# Patient Record
Sex: Female | Born: 1980 | Race: White | Hispanic: No | Marital: Single
Health system: Southern US, Community
[De-identification: ages and names within clinical notes are randomized; demographics above are authoritative.]

## PROBLEM LIST (undated history)

## (undated) DIAGNOSIS — T148XXA Other injury of unspecified body region, initial encounter: Secondary | ICD-10-CM

## (undated) DIAGNOSIS — F1910 Other psychoactive substance abuse, uncomplicated: Secondary | ICD-10-CM

## (undated) HISTORY — PX: TUBAL LIGATION: SHX77

## (undated) HISTORY — PX: APPENDECTOMY: SHX54

---
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
## 2001-02-07 ENCOUNTER — Inpatient Hospital Stay (HOSPITAL_COMMUNITY): Admission: AD | Admit: 2001-02-07 | Discharge: 2001-02-07 | Payer: Self-pay | Admitting: *Deleted

## 2001-03-07 ENCOUNTER — Inpatient Hospital Stay (HOSPITAL_COMMUNITY): Admission: AD | Admit: 2001-03-07 | Discharge: 2001-03-10 | Payer: Self-pay | Admitting: *Deleted

## 2005-06-28 ENCOUNTER — Ambulatory Visit (HOSPITAL_COMMUNITY): Admission: RE | Admit: 2005-06-28 | Discharge: 2005-06-28 | Payer: Self-pay | Admitting: *Deleted

## 2005-07-06 ENCOUNTER — Ambulatory Visit: Payer: Self-pay | Admitting: *Deleted

## 2005-07-10 ENCOUNTER — Inpatient Hospital Stay (HOSPITAL_COMMUNITY): Admission: AD | Admit: 2005-07-10 | Discharge: 2005-07-10 | Payer: Self-pay | Admitting: Obstetrics and Gynecology

## 2005-07-20 ENCOUNTER — Ambulatory Visit: Payer: Self-pay | Admitting: Family Medicine

## 2005-08-02 ENCOUNTER — Ambulatory Visit: Payer: Self-pay | Admitting: Obstetrics & Gynecology

## 2005-08-13 ENCOUNTER — Ambulatory Visit: Payer: Self-pay | Admitting: *Deleted

## 2005-08-13 ENCOUNTER — Inpatient Hospital Stay (HOSPITAL_COMMUNITY): Admission: AD | Admit: 2005-08-13 | Discharge: 2005-08-13 | Payer: Self-pay | Admitting: Obstetrics & Gynecology

## 2005-08-16 ENCOUNTER — Inpatient Hospital Stay (HOSPITAL_COMMUNITY): Admission: AD | Admit: 2005-08-16 | Discharge: 2005-08-16 | Payer: Self-pay | Admitting: Obstetrics and Gynecology

## 2005-08-16 ENCOUNTER — Ambulatory Visit: Payer: Self-pay | Admitting: *Deleted

## 2005-08-17 ENCOUNTER — Ambulatory Visit: Payer: Self-pay | Admitting: Family Medicine

## 2005-08-22 ENCOUNTER — Ambulatory Visit: Payer: Self-pay | Admitting: Obstetrics and Gynecology

## 2005-08-22 ENCOUNTER — Inpatient Hospital Stay (HOSPITAL_COMMUNITY): Admission: AD | Admit: 2005-08-22 | Discharge: 2005-08-22 | Payer: Self-pay | Admitting: *Deleted

## 2005-08-24 ENCOUNTER — Ambulatory Visit: Payer: Self-pay | Admitting: Family Medicine

## 2005-08-28 ENCOUNTER — Inpatient Hospital Stay (HOSPITAL_COMMUNITY): Admission: AD | Admit: 2005-08-28 | Discharge: 2005-08-28 | Payer: Self-pay | Admitting: Obstetrics and Gynecology

## 2005-08-28 ENCOUNTER — Ambulatory Visit: Payer: Self-pay | Admitting: Obstetrics and Gynecology

## 2005-08-31 ENCOUNTER — Ambulatory Visit: Payer: Self-pay | Admitting: Family Medicine

## 2005-09-07 ENCOUNTER — Ambulatory Visit: Payer: Self-pay | Admitting: *Deleted

## 2005-09-14 ENCOUNTER — Inpatient Hospital Stay (HOSPITAL_COMMUNITY): Admission: AD | Admit: 2005-09-14 | Discharge: 2005-09-17 | Payer: Self-pay | Admitting: Family Medicine

## 2005-09-14 ENCOUNTER — Ambulatory Visit: Payer: Self-pay | Admitting: Family Medicine

## 2005-09-14 ENCOUNTER — Ambulatory Visit: Payer: Self-pay | Admitting: Obstetrics and Gynecology

## 2016-09-20 DIAGNOSIS — F250 Schizoaffective disorder, bipolar type: Secondary | ICD-10-CM

## 2016-09-20 DIAGNOSIS — Z9151 Personal history of suicidal behavior: Secondary | ICD-10-CM

## 2016-09-20 HISTORY — DX: Schizoaffective disorder, bipolar type: F25.0

## 2016-09-20 HISTORY — DX: Personal history of suicidal behavior: Z91.51

## 2018-09-19 ENCOUNTER — Emergency Department (HOSPITAL_COMMUNITY)
Admission: EM | Admit: 2018-09-19 | Discharge: 2018-09-19 | Disposition: A | Discharge status: Home / Self Care | Payer: Self-pay | Attending: Emergency Medicine | Admitting: Emergency Medicine

## 2018-09-19 ENCOUNTER — Other Ambulatory Visit: Payer: Self-pay

## 2018-09-19 ENCOUNTER — Encounter (HOSPITAL_COMMUNITY): Payer: Self-pay

## 2018-09-19 ENCOUNTER — Emergency Department (HOSPITAL_COMMUNITY): Payer: Self-pay

## 2018-09-19 DIAGNOSIS — J40 Bronchitis, not specified as acute or chronic: Secondary | ICD-10-CM

## 2018-09-19 DIAGNOSIS — J0110 Acute frontal sinusitis, unspecified: Principal | ICD-10-CM

## 2018-09-19 DIAGNOSIS — F17210 Nicotine dependence, cigarettes, uncomplicated: Secondary | ICD-10-CM

## 2018-09-19 DIAGNOSIS — J209 Acute bronchitis, unspecified: Secondary | ICD-10-CM

## 2018-09-19 MED ORDER — PREDNISONE 20 MG PO TABS: 40.0000 mg | ORAL_TABLET | Freq: Every day | ORAL | 0 refills | Status: DC

## 2018-09-19 MED ORDER — AMOXICILLIN-POT CLAVULANATE 875-125 MG PO TABS: 1.0000 | ORAL_TABLET | Freq: Two times a day (BID) | ORAL | 0 refills | Status: DC

## 2018-09-19 NOTE — ED Triage Notes (Signed)
Pt states she has had cough X1 week. She also reports frontal headache, d/t sinus drainage. She reports cough is dry. No distress noted.

## 2018-09-19 NOTE — Discharge Instructions (Addendum)
Take augmentin twice daily for the next 5 days Take Prednisone once daily for 5 days Use Flonase for nasal congestion Use Mucinex DM for chest congestion Return if you are worsening
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
## 2019-07-14 ENCOUNTER — Emergency Department (HOSPITAL_COMMUNITY)
Admission: EM | Admit: 2019-07-14 | Discharge: 2019-07-14 | Disposition: A | Discharge status: Home / Self Care | Payer: Self-pay | Attending: Emergency Medicine | Admitting: Emergency Medicine

## 2019-07-14 ENCOUNTER — Other Ambulatory Visit: Payer: Self-pay

## 2019-07-14 ENCOUNTER — Encounter (HOSPITAL_COMMUNITY): Payer: Self-pay | Admitting: Emergency Medicine

## 2019-07-14 DIAGNOSIS — X58XXXA Exposure to other specified factors, initial encounter: Secondary | ICD-10-CM | POA: Insufficient documentation

## 2019-07-14 DIAGNOSIS — Y999 Unspecified external cause status: Secondary | ICD-10-CM | POA: Insufficient documentation

## 2019-07-14 DIAGNOSIS — Y929 Unspecified place or not applicable: Secondary | ICD-10-CM | POA: Insufficient documentation

## 2019-07-14 DIAGNOSIS — F17210 Nicotine dependence, cigarettes, uncomplicated: Secondary | ICD-10-CM | POA: Insufficient documentation

## 2019-07-14 DIAGNOSIS — T148XXA Other injury of unspecified body region, initial encounter: Secondary | ICD-10-CM

## 2019-07-14 DIAGNOSIS — S301XXA Contusion of abdominal wall, initial encounter: Secondary | ICD-10-CM | POA: Insufficient documentation

## 2019-07-14 DIAGNOSIS — Y939 Activity, unspecified: Secondary | ICD-10-CM | POA: Insufficient documentation
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
## 2019-07-14 NOTE — Discharge Instructions (Signed)
Apply heat to the affected area Please return for any worsening symptoms

## 2019-07-14 NOTE — ED Notes (Signed)
Pt verbalizes understanding of d/c instructions. Pt ambulatory at d/c with all belongings.   

## 2019-07-14 NOTE — ED Notes (Signed)
ED Provider at bedside. 
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
## 2019-07-15 ENCOUNTER — Encounter (HOSPITAL_COMMUNITY): Payer: Self-pay | Admitting: Emergency Medicine

## 2020-03-01 ENCOUNTER — Other Ambulatory Visit: Payer: Self-pay

## 2020-03-01 ENCOUNTER — Encounter (HOSPITAL_COMMUNITY): Payer: Self-pay | Admitting: Emergency Medicine

## 2020-03-01 ENCOUNTER — Emergency Department (HOSPITAL_COMMUNITY)
Admission: EM | Admit: 2020-03-01 | Discharge: 2020-03-01 | Disposition: A | Discharge status: Home / Self Care | Payer: Self-pay | Attending: Emergency Medicine | Admitting: Emergency Medicine

## 2020-03-01 DIAGNOSIS — F17210 Nicotine dependence, cigarettes, uncomplicated: Secondary | ICD-10-CM | POA: Insufficient documentation

## 2020-03-01 DIAGNOSIS — M79652 Pain in left thigh: Secondary | ICD-10-CM | POA: Insufficient documentation

## 2020-03-01 DIAGNOSIS — M5432 Sciatica, left side: Secondary | ICD-10-CM | POA: Insufficient documentation

## 2020-03-01 MED ORDER — METHYLPREDNISOLONE 4 MG PO TBPK: ORAL_TABLET | ORAL | 0 refills | Status: DC

## 2020-03-01 NOTE — Discharge Instructions (Signed)
Follow-up with your primary care doctor if needed.  Please take 600 mg of Motrin every 8 hours for the next 5 days, 1000 mg of Tylenol 4 times a day for the next 5 days and then as needed.  Take Solu-Medrol Dosepak as prescribed.

## 2020-03-01 NOTE — ED Triage Notes (Signed)
Pt c/o left hip pain that started 2 days ago that radiates down her left leg. Denies falls, injuries or lifting.

## 2020-03-01 NOTE — ED Provider Notes (Signed)
[facility] DEPT Provider Note   CSN: [id number] Arrival date & time: 03/01/20  1041     History Chief Complaint  Patient presents with  . hip pain  . Leg Pain    Holly Baxter is a 39 y.o. female.  Patient with left hip pain, back pain that shoots into her left thigh.  Works lifting heavy objects, multiple repetitive actions.  Denies any loss of bowel or bladder, no numbness, tingling, weakness in the lower legs.  The history is provided by the patient.  Leg Pain Location:  Leg and hip Hip location:  L hip Leg location:  L leg Pain details:    Quality:  Aching   Radiates to:  Groin   Severity:  Mild   Onset quality:  Gradual   Timing:  Constant   Progression:  Unchanged Chronicity:  New Prior injury to area:  No Relieved by:  Nothing Worsened by:  Activity Associated symptoms: no back pain, no decreased ROM, no fatigue, no fever, no itching, no muscle weakness, no neck pain, no numbness, no stiffness, no swelling and no tingling        History reviewed. No pertinent past medical history.  There are no problems to display for this patient.   History reviewed. No pertinent surgical history.   OB History   No obstetric history on file.     No family history on file.  Social History   Tobacco Use  . Smoking status: Current Some Day Smoker    Types: Cigarettes  . Smokeless tobacco: Never Used  Substance Use Topics  . Alcohol use: Not Currently  . Drug use: Yes    Comment: hx of drug abuse, recovering addict    Home Medications Prior to Admission medications   Medication Sig Start Date End Date Taking? Authorizing Provider  amoxicillin-clavulanate (AUGMENTIN) 875-125 MG tablet Take 1 tablet by mouth every 12 (twelve) hours. 09/19/18   Recardo Evangelist, PA-C  methylPREDNISolone (MEDROL DOSEPAK) 4 MG TBPK tablet Follow package inserts 03/01/20   Quentez Lober, DO  predniSONE (DELTASONE) 20 MG tablet Take 2 tablets (40  mg total) by mouth daily. 09/19/18   Recardo Evangelist, PA-C    Allergies    Other  Review of Systems   Review of Systems  Constitutional: Negative for chills, fatigue and fever.  HENT: Negative for ear pain and sore throat.   Eyes: Negative for pain and visual disturbance.  Respiratory: Negative for cough and shortness of breath.   Cardiovascular: Negative for chest pain and palpitations.  Gastrointestinal: Negative for abdominal pain and vomiting.  Genitourinary: Negative for dysuria and hematuria.  Musculoskeletal: Positive for gait problem. Negative for arthralgias, back pain, joint swelling, myalgias, neck pain, neck stiffness and stiffness.  Skin: Negative for color change, itching and rash.  Neurological: Negative for dizziness, seizures, syncope, weakness and light-headedness.  All other systems reviewed and are negative.   Physical Exam Updated Vital Signs BP 123/89 (BP Location: Left Arm)   Pulse 82   Temp 97.9 F (36.6 C) (Oral)   Resp 18   LMP 02/19/2020   SpO2 97%   Physical Exam Vitals and nursing note reviewed.  Constitutional:      General: She is not in acute distress.    Appearance: She is well-developed.  HENT:     Head: Normocephalic and atraumatic.     Nose: Nose normal.     Mouth/Throat:     Mouth: Mucous membranes are moist.  Eyes:     Conjunctiva/sclera: Conjunctivae normal.  Cardiovascular:     Rate and Rhythm: Normal rate and regular rhythm.     Heart sounds: No murmur.  Pulmonary:     Effort: Pulmonary effort is normal. No respiratory distress.     Breath sounds: Normal breath sounds.  Abdominal:     Palpations: Abdomen is soft.     Tenderness: There is no abdominal tenderness.  Musculoskeletal:        General: Tenderness present. Normal range of motion.     Cervical back: Normal range of motion and neck supple.     Comments: Positive straight leg test, tenderness to left piriformis area, left paraspinal muscles of the lumbar area, no  midline spinal tenderness  Skin:    General: Skin is warm and dry.  Neurological:     General: No focal deficit present.     Mental Status: She is alert and oriented to person, place, and time.     Sensory: No sensory deficit.     Motor: No weakness.     Comments: 5+ out of 5 strength in the lower extremity, normal sensation lower extremities, antalgic gait     ED Results / Procedures / Treatments   Labs (all labs ordered are listed, but only abnormal results are displayed) Labs Reviewed - No data to display  EKG None  Radiology No results found.  Procedures Procedures (including critical care time)  Medications Ordered in ED Medications - No data to display  ED Course  I have reviewed the triage vital signs and the nursing notes.  Pertinent labs & imaging results that were available during my care of the patient were reviewed by me and considered in my medical decision making (see chart for details).    MDM Rules/Calculators/A&P                      Holly Baxter is a 39 year old female who presents to the ED with low back pain, left leg pain.  Patient describes sciatic type nerve pain.  Pain that goes from her low back into her left thigh.  No numbness, no weakness.  No loss of bowel or bladder.  No concern for cauda equina.  Normal neurological exam.  Antalgic gait in the room.  Denies pregnancy as she has tubal ligation in the past.  Will prescribe Solu-Medrol Dosepak.  Recommend Tylenol, Motrin, rest.  Given return precautions and discharged from the ED in good condition.  This chart was dictated using voice recognition software.  Despite best efforts to proofread,  errors can occur which can change the documentation meaning.   Final Clinical Impression(s) / ED Diagnoses Final diagnoses:  Sciatica of left side    Rx / DC Orders ED Discharge Orders         Ordered    methylPREDNISolone (MEDROL DOSEPAK) 4 MG TBPK tablet     03/01/20 1109            Jaire Pinkham, DO 03/01/20 1109

## 2020-04-11 ENCOUNTER — Emergency Department (HOSPITAL_COMMUNITY)
Admission: EM | Admit: 2020-04-11 | Discharge: 2020-04-11 | Disposition: A | Discharge status: Home / Self Care | Payer: No Typology Code available for payment source | Attending: Emergency Medicine | Admitting: Emergency Medicine

## 2020-04-11 ENCOUNTER — Emergency Department (HOSPITAL_COMMUNITY): Payer: No Typology Code available for payment source

## 2020-04-11 DIAGNOSIS — Y900 Blood alcohol level of less than 20 mg/100 ml: Secondary | ICD-10-CM | POA: Insufficient documentation

## 2020-04-11 DIAGNOSIS — S52322A Displaced transverse fracture of shaft of left radius, initial encounter for closed fracture: Secondary | ICD-10-CM | POA: Diagnosis not present

## 2020-04-11 DIAGNOSIS — Z20822 Contact with and (suspected) exposure to covid-19: Secondary | ICD-10-CM | POA: Diagnosis not present

## 2020-04-11 DIAGNOSIS — T07XXXA Unspecified multiple injuries, initial encounter: Secondary | ICD-10-CM

## 2020-04-11 DIAGNOSIS — S90811A Abrasion, right foot, initial encounter: Secondary | ICD-10-CM | POA: Diagnosis not present

## 2020-04-11 DIAGNOSIS — S0990XA Unspecified injury of head, initial encounter: Secondary | ICD-10-CM | POA: Diagnosis not present

## 2020-04-11 DIAGNOSIS — Y929 Unspecified place or not applicable: Secondary | ICD-10-CM | POA: Diagnosis not present

## 2020-04-11 DIAGNOSIS — S52222A Displaced transverse fracture of shaft of left ulna, initial encounter for closed fracture: Secondary | ICD-10-CM | POA: Insufficient documentation

## 2020-04-11 DIAGNOSIS — S161XXA Strain of muscle, fascia and tendon at neck level, initial encounter: Secondary | ICD-10-CM | POA: Diagnosis not present

## 2020-04-11 DIAGNOSIS — F10920 Alcohol use, unspecified with intoxication, uncomplicated: Secondary | ICD-10-CM | POA: Diagnosis not present

## 2020-04-11 DIAGNOSIS — Y939 Activity, unspecified: Secondary | ICD-10-CM | POA: Insufficient documentation

## 2020-04-11 DIAGNOSIS — S52202A Unspecified fracture of shaft of left ulna, initial encounter for closed fracture: Secondary | ICD-10-CM

## 2020-04-11 DIAGNOSIS — Y999 Unspecified external cause status: Secondary | ICD-10-CM | POA: Insufficient documentation

## 2020-04-11 DIAGNOSIS — S50811A Abrasion of right forearm, initial encounter: Secondary | ICD-10-CM | POA: Diagnosis not present

## 2020-04-11 DIAGNOSIS — S5292XA Unspecified fracture of left forearm, initial encounter for closed fracture: Secondary | ICD-10-CM

## 2020-04-11 DIAGNOSIS — T1490XA Injury, unspecified, initial encounter: Secondary | ICD-10-CM

## 2020-04-11 HISTORY — DX: Unspecified fracture of shaft of left ulna, initial encounter for closed fracture: S52.202A

## 2020-04-11 LAB — CBC
HCT: 38.9 % (ref 36.0–46.0)
Hemoglobin: 13.3 g/dL (ref 12.0–15.0)
MCH: 30.4 pg (ref 26.0–34.0)
MCHC: 34.2 g/dL (ref 30.0–36.0)
MCV: 88.8 fL (ref 80.0–100.0)
Platelets: 278 10*3/uL (ref 150–400)
RBC: 4.38 MIL/uL (ref 3.87–5.11)
RDW: 13.4 % (ref 11.5–15.5)
WBC: 7.8 10*3/uL (ref 4.0–10.5)
nRBC: 0 % (ref 0.0–0.2)

## 2020-04-11 LAB — COMPREHENSIVE METABOLIC PANEL
ALT: 21 U/L (ref 0–44)
AST: 26 U/L (ref 15–41)
Albumin: 4.2 g/dL (ref 3.5–5.0)
Alkaline Phosphatase: 67 U/L (ref 38–126)
Anion gap: 16 — ABNORMAL HIGH (ref 5–15)
BUN: 8 mg/dL (ref 6–20)
CO2: 16 mmol/L — ABNORMAL LOW (ref 22–32)
Calcium: 9.4 mg/dL (ref 8.9–10.3)
Chloride: 103 mmol/L (ref 98–111)
Creatinine, Ser: 0.89 mg/dL (ref 0.44–1.00)
GFR calc Af Amer: >60 mL/min (ref >60)
GFR calc non Af Amer: >60 mL/min (ref >60)
Glucose, Bld: 90 mg/dL (ref 70–99)
Potassium: 3.2 mmol/L — ABNORMAL LOW (ref 3.5–5.1)
Sodium: 135 mmol/L (ref 135–145)
Total Bilirubin: 1.3 mg/dL — ABNORMAL HIGH (ref 0.3–1.2)
Total Protein: 6.8 g/dL (ref 6.5–8.1)

## 2020-04-11 LAB — I-STAT CHEM 8, ED
BUN: 7 mg/dL (ref 6–20)
Calcium, Ion: 1.1 mmol/L — ABNORMAL LOW (ref 1.15–1.40)
Chloride: 105 mmol/L (ref 98–111)
Creatinine, Ser: 0.8 mg/dL (ref 0.44–1.00)
Glucose, Bld: 82 mg/dL (ref 70–99)
HCT: 39 % (ref 36.0–46.0)
Hemoglobin: 13.3 g/dL (ref 12.0–15.0)
Potassium: 3.1 mmol/L — ABNORMAL LOW (ref 3.5–5.1)
Sodium: 135 mmol/L (ref 135–145)
TCO2: 14 mmol/L — ABNORMAL LOW (ref 22–32)

## 2020-04-11 LAB — ETHANOL: Alcohol, Ethyl (B): 17 mg/dL — ABNORMAL HIGH (ref <10)

## 2020-04-11 LAB — SARS CORONAVIRUS 2 BY RT PCR (HOSPITAL ORDER, PERFORMED IN ~~LOC~~ HOSPITAL LAB): SARS Coronavirus 2: NEGATIVE

## 2020-04-11 LAB — SAMPLE TO BLOOD BANK

## 2020-04-11 LAB — PROTIME-INR
INR: 1 (ref 0.8–1.2)
Prothrombin Time: 13.2 seconds (ref 11.4–15.2)

## 2020-04-11 MED ORDER — IOHEXOL 300 MG/ML  SOLN
100.0000 mL | Freq: Once | INTRAMUSCULAR | Status: AC | PRN
  Administered 2020-04-11: 100 mL via INTRAVENOUS

## 2020-04-11 MED ORDER — MORPHINE SULFATE (PF) 4 MG/ML IV SOLN
INTRAVENOUS | Status: AC
  Filled 2020-04-11: qty 1

## 2020-04-11 MED ORDER — HYDROMORPHONE HCL 1 MG/ML IJ SOLN
1.0000 mg | Freq: Once | INTRAMUSCULAR | Status: AC
  Administered 2020-04-11: 1 mg via INTRAVENOUS
  Filled 2020-04-11: qty 1

## 2020-04-11 MED ORDER — OXYCODONE-ACETAMINOPHEN 5-325 MG PO TABS: 1.0000 | ORAL_TABLET | Freq: Four times a day (QID) | ORAL | 0 refills | Status: DC | PRN

## 2020-04-11 MED ORDER — MORPHINE SULFATE (PF) 4 MG/ML IV SOLN
4.0000 mg | Freq: Once | INTRAVENOUS | Status: AC
  Administered 2020-04-11: 4 mg via INTRAVENOUS

## 2020-04-11 NOTE — Progress Notes (Signed)
Orthopedic Tech Progress Note Patient Details:  Holly Baxter 06-Jun-1981 [id number]  Ortho Devices Type of Ortho Device: Arm sling, Post (long) splint Splint Material: Fiberglass Ortho Device/Splint Interventions: Application, Adjustment   Post Interventions Patient Tolerated: Poor   [name] 04/11/2020, 4:10 PM

## 2020-04-11 NOTE — Discharge Instructions (Addendum)
Go to Dr. Greig Right office at the attached address tomorrow morning at 8:30 AM.  He is expecting to see you then and will make arrangements for the surgical repair of your forearm.  In the meantime, keep your arm elevated, iced for 30 minutes every 2 hours while awake, and rested.  Take Percocet as prescribed as needed for pain.

## 2020-04-11 NOTE — ED Provider Notes (Signed)
Pt placed in a sugar tong splint.  The pt is able to ambulate.  She is told to show up at Dr. Greig Right office tomorrow at 0830.  Pt is stable for d/c.  Return if worse.   Jacalyn Lefevre, MD 04/11/20 1651

## 2020-04-11 NOTE — ED Notes (Signed)
Pt transported to CT ?
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
## 2020-04-12 ENCOUNTER — Other Ambulatory Visit: Payer: Self-pay

## 2020-04-12 ENCOUNTER — Encounter (HOSPITAL_BASED_OUTPATIENT_CLINIC_OR_DEPARTMENT_OTHER): Payer: Self-pay | Admitting: Orthopedic Surgery

## 2020-04-12 NOTE — Progress Notes (Signed)
Holly Baxter, Or scheduler for Dr. Eulah Pont, informed of need for pre op orders.Marland Kitchen

## 2020-04-12 NOTE — Progress Notes (Signed)
Spoke w/ via phone for pre-op interview--- PT  Lab needs dos---- Istat (@ ED 04-11-2020 with potassium 3.1, ask MDA if need repeat).  Also ordered urine drug screen, pt stated last used cocaine  04-10-2020, asked MDA if needed.  And need Urine preg.             Lab results------ lab done 04-11-2020 @ ED COVID test ------ covid test done @ED  04-11-2020, negative result in epic,  Pt stated went straight and been quarantined with exception to Dr 04-13-2020 appointment this morning  Arrive at ------- 0645 NPO after ------ [place] Medications to take morning of surgery ----- NONE Diabetic medication ----- n/a Patient Special Instructions ----- do not do any drugs or drink alcohol Pre-Op special Istructions ----- n/a Patient verbalized understanding of instructions that were given at this phone interview. Patient denies shortness of breath, chest pain, fever, cough a this phone interview.   Anesthesia review:  Due to drug use.  Chart to be reviewed by Eulah Pont PA.

## 2020-04-13 ENCOUNTER — Ambulatory Visit (HOSPITAL_BASED_OUTPATIENT_CLINIC_OR_DEPARTMENT_OTHER)
Admission: RE | Admit: 2020-04-13 | Discharge: 2020-04-13 | Disposition: A | Discharge status: Home / Self Care | Payer: Self-pay | Attending: Orthopedic Surgery | Admitting: Orthopedic Surgery

## 2020-04-13 ENCOUNTER — Encounter (HOSPITAL_BASED_OUTPATIENT_CLINIC_OR_DEPARTMENT_OTHER)
Admission: RE | Disposition: A | Discharge status: Home / Self Care | Payer: Self-pay | Source: Home / Self Care | Attending: Orthopedic Surgery

## 2020-04-13 ENCOUNTER — Ambulatory Visit (HOSPITAL_BASED_OUTPATIENT_CLINIC_OR_DEPARTMENT_OTHER): Payer: Self-pay | Admitting: Physician Assistant

## 2020-04-13 ENCOUNTER — Other Ambulatory Visit: Payer: Self-pay

## 2020-04-13 ENCOUNTER — Encounter (HOSPITAL_BASED_OUTPATIENT_CLINIC_OR_DEPARTMENT_OTHER): Payer: Self-pay | Admitting: Orthopedic Surgery

## 2020-04-13 DIAGNOSIS — F17210 Nicotine dependence, cigarettes, uncomplicated: Secondary | ICD-10-CM | POA: Insufficient documentation

## 2020-04-13 DIAGNOSIS — S52322A Displaced transverse fracture of shaft of left radius, initial encounter for closed fracture: Secondary | ICD-10-CM | POA: Insufficient documentation

## 2020-04-13 DIAGNOSIS — S52202S Unspecified fracture of shaft of left ulna, sequela: Secondary | ICD-10-CM

## 2020-04-13 DIAGNOSIS — F250 Schizoaffective disorder, bipolar type: Secondary | ICD-10-CM | POA: Insufficient documentation

## 2020-04-13 DIAGNOSIS — S52222A Displaced transverse fracture of shaft of left ulna, initial encounter for closed fracture: Secondary | ICD-10-CM | POA: Insufficient documentation

## 2020-04-13 DIAGNOSIS — M659 Synovitis and tenosynovitis, unspecified: Secondary | ICD-10-CM | POA: Insufficient documentation

## 2020-04-13 HISTORY — PX: ORIF RADIAL FRACTURE: SHX5113

## 2020-04-13 HISTORY — DX: Other injury of unspecified body region, initial encounter: T14.8XXA

## 2020-04-13 HISTORY — DX: Other psychoactive substance abuse, uncomplicated: F19.10

## 2020-04-13 LAB — POCT I-STAT, CHEM 8
BUN: 7 mg/dL (ref 6–20)
Calcium, Ion: 1.25 mmol/L (ref 1.15–1.40)
Chloride: 106 mmol/L (ref 98–111)
Creatinine, Ser: 0.7 mg/dL (ref 0.44–1.00)
Glucose, Bld: 101 mg/dL — ABNORMAL HIGH (ref 70–99)
HCT: 34 % — ABNORMAL LOW (ref 36.0–46.0)
Hemoglobin: 11.6 g/dL — ABNORMAL LOW (ref 12.0–15.0)
Potassium: 3.3 mmol/L — ABNORMAL LOW (ref 3.5–5.1)
Sodium: 141 mmol/L (ref 135–145)
TCO2: 26 mmol/L (ref 22–32)

## 2020-04-13 LAB — POCT PREGNANCY, URINE: Preg Test, Ur: NEGATIVE

## 2020-04-13 LAB — RAPID URINE DRUG SCREEN, HOSP PERFORMED
Amphetamines: NOT DETECTED
Barbiturates: NOT DETECTED
Benzodiazepines: NOT DETECTED
Cocaine: POSITIVE — AB
Opiates: POSITIVE — AB
Tetrahydrocannabinol: POSITIVE — AB

## 2020-04-13 SURGERY — OPEN REDUCTION INTERNAL FIXATION (ORIF) RADIAL FRACTURE
Anesthesia: General | Site: Arm Lower | Laterality: Left

## 2020-04-13 MED ORDER — DEXMEDETOMIDINE HCL IN NACL 400 MCG/100ML IV SOLN
INTRAVENOUS | Status: DC | PRN
Start: 2020-04-13 — End: 2020-04-13
  Administered 2020-04-13 (×6): 4 ug via INTRAVENOUS

## 2020-04-13 MED ORDER — LACTATED RINGERS IV SOLN: INTRAVENOUS | Status: DC

## 2020-04-13 MED ORDER — CELECOXIB 200 MG PO CAPS: 200.0000 mg | ORAL_CAPSULE | Freq: Two times a day (BID) | ORAL | 0 refills | Status: AC

## 2020-04-13 MED ORDER — MIDAZOLAM HCL 2 MG/2ML IJ SOLN
2.0000 mg | Freq: Once | INTRAMUSCULAR | Status: AC
  Administered 2020-04-13: 2 mg via INTRAVENOUS

## 2020-04-13 MED ORDER — MIDAZOLAM HCL 2 MG/2ML IJ SOLN
INTRAMUSCULAR | Status: AC
  Filled 2020-04-13: qty 2

## 2020-04-13 MED ORDER — CHLORHEXIDINE GLUCONATE 4 % EX LIQD: 60.0000 mL | Freq: Once | CUTANEOUS | Status: DC

## 2020-04-13 MED ORDER — MIDAZOLAM HCL 2 MG/2ML IJ SOLN
INTRAMUSCULAR | Status: DC | PRN
  Administered 2020-04-13: 2 mg via INTRAVENOUS

## 2020-04-13 MED ORDER — ROPIVACAINE HCL 5 MG/ML IJ SOLN
INTRAMUSCULAR | Status: DC | PRN
  Administered 2020-04-13: 30 mL via EPIDURAL

## 2020-04-13 MED ORDER — OXYCODONE HCL 5 MG PO TABS: 5.0000 mg | ORAL_TABLET | ORAL | 0 refills | Status: AC | PRN

## 2020-04-13 MED ORDER — PROPOFOL 500 MG/50ML IV EMUL
INTRAVENOUS | Status: AC
  Filled 2020-04-13: qty 50

## 2020-04-13 MED ORDER — ORAL CARE MOUTH RINSE: 15.0000 mL | Freq: Once | OROMUCOSAL | Status: DC

## 2020-04-13 MED ORDER — LIDOCAINE 2% (20 MG/ML) 5 ML SYRINGE
INTRAMUSCULAR | Status: AC
  Filled 2020-04-13: qty 5

## 2020-04-13 MED ORDER — ONDANSETRON HCL 4 MG/2ML IJ SOLN: 4.0000 mg | Freq: Once | INTRAMUSCULAR | Status: DC | PRN

## 2020-04-13 MED ORDER — HYDROCODONE-ACETAMINOPHEN 7.5-325 MG PO TABS: 1.0000 | ORAL_TABLET | Freq: Once | ORAL | Status: DC | PRN

## 2020-04-13 MED ORDER — ONDANSETRON HCL 4 MG/2ML IJ SOLN
INTRAMUSCULAR | Status: DC | PRN
  Administered 2020-04-13: 4 mg via INTRAVENOUS

## 2020-04-13 MED ORDER — CEFAZOLIN SODIUM-DEXTROSE 2-4 GM/100ML-% IV SOLN
2.0000 g | INTRAVENOUS | Status: AC
  Administered 2020-04-13: 2 g via INTRAVENOUS

## 2020-04-13 MED ORDER — ACETAMINOPHEN 500 MG PO TABS
1000.0000 mg | ORAL_TABLET | Freq: Three times a day (TID) | ORAL | 0 refills | Status: AC
Start: 2020-04-13 — End: 2020-04-23

## 2020-04-13 MED ORDER — CHLORHEXIDINE GLUCONATE 0.12 % MT SOLN
OROMUCOSAL | Status: AC
  Filled 2020-04-13: qty 15

## 2020-04-13 MED ORDER — ONDANSETRON HCL 4 MG/2ML IJ SOLN
INTRAMUSCULAR | Status: AC
  Filled 2020-04-13: qty 2

## 2020-04-13 MED ORDER — FENTANYL CITRATE (PF) 100 MCG/2ML IJ SOLN
INTRAMUSCULAR | Status: AC
  Filled 2020-04-13: qty 2

## 2020-04-13 MED ORDER — ACETAMINOPHEN 500 MG PO TABS
ORAL_TABLET | ORAL | Status: AC
  Filled 2020-04-13: qty 2

## 2020-04-13 MED ORDER — FENTANYL CITRATE (PF) 100 MCG/2ML IJ SOLN
100.0000 ug | Freq: Once | INTRAMUSCULAR | Status: AC
  Administered 2020-04-13: 100 ug via INTRAVENOUS

## 2020-04-13 MED ORDER — GABAPENTIN 300 MG PO CAPS: 300.0000 mg | ORAL_CAPSULE | Freq: Two times a day (BID) | ORAL | 0 refills | Status: AC

## 2020-04-13 MED ORDER — DEXAMETHASONE SODIUM PHOSPHATE 10 MG/ML IJ SOLN
INTRAMUSCULAR | Status: AC
  Filled 2020-04-13: qty 1

## 2020-04-13 MED ORDER — LIDOCAINE 2% (20 MG/ML) 5 ML SYRINGE
INTRAMUSCULAR | Status: DC | PRN
  Administered 2020-04-13: 80 mg via INTRAVENOUS

## 2020-04-13 MED ORDER — PROPOFOL 10 MG/ML IV BOLUS
INTRAVENOUS | Status: DC | PRN
  Administered 2020-04-13: 150 mg via INTRAVENOUS

## 2020-04-13 MED ORDER — DEXAMETHASONE SODIUM PHOSPHATE 10 MG/ML IJ SOLN
INTRAMUSCULAR | Status: DC | PRN
  Administered 2020-04-13: 10 mg via INTRAVENOUS

## 2020-04-13 MED ORDER — ONDANSETRON HCL 4 MG PO TABS: 4.0000 mg | ORAL_TABLET | Freq: Three times a day (TID) | ORAL | 0 refills | Status: AC | PRN

## 2020-04-13 MED ORDER — ACETAMINOPHEN 500 MG PO TABS
1000.0000 mg | ORAL_TABLET | Freq: Once | ORAL | Status: AC
  Administered 2020-04-13: 1000 mg via ORAL

## 2020-04-13 MED ORDER — FENTANYL CITRATE (PF) 100 MCG/2ML IJ SOLN
INTRAMUSCULAR | Status: DC | PRN
  Administered 2020-04-13 (×2): 50 ug via INTRAVENOUS

## 2020-04-13 MED ORDER — CHLORHEXIDINE GLUCONATE 0.12 % MT SOLN: 15.0000 mL | Freq: Once | OROMUCOSAL | Status: DC

## 2020-04-13 MED ORDER — CEFAZOLIN SODIUM-DEXTROSE 2-4 GM/100ML-% IV SOLN
INTRAVENOUS | Status: AC
  Filled 2020-04-13: qty 100

## 2020-04-13 MED ORDER — FENTANYL CITRATE (PF) 100 MCG/2ML IJ SOLN: 25.0000 ug | INTRAMUSCULAR | Status: DC | PRN

## 2020-04-13 MED ORDER — DEXMEDETOMIDINE HCL IN NACL 200 MCG/50ML IV SOLN
INTRAVENOUS | Status: AC
  Filled 2020-04-13: qty 50

## 2020-04-13 SURGICAL SUPPLY — 98 items
2.0mm drill bit ×1 IMPLANT
2.6 mm drill bit ×2 IMPLANT
2.7 mm drill bit ×1 IMPLANT
APL PRP STRL LF DISP 70% ISPRP (MISCELLANEOUS) ×1
APL SKNCLS STERI-STRIP NONHPOA (GAUZE/BANDAGES/DRESSINGS) ×1
BENZOIN TINCTURE PRP APPL 2/3 (GAUZE/BANDAGES/DRESSINGS) ×2 IMPLANT
BIT DRILL 122X2.7XOVR NS (BIT) IMPLANT
BIT DRILL 135X2XAO FIT SCL (BIT) IMPLANT
BIT DRILL 2.0 (BIT) ×3
BIT DRILL 2.6 (BIT) ×2
BIT DRILL 2.6MM (BIT) ×1
BIT DRILL 2.6X VARIAX 2 (BIT) IMPLANT
BIT DRILL 2.7 (BIT) ×3
BIT DRL 122X2.7XOVR NS (BIT) ×1
BIT DRL 135X2XAO FIT SCL (BIT) ×1
BIT DRL 2.6X VARIAX 2 (BIT) ×1
BLADE CLIPPER SENSICLIP SURGIC (BLADE) IMPLANT
BLADE SURG 15 STRL LF DISP TIS (BLADE) ×1 IMPLANT
BLADE SURG 15 STRL SS (BLADE) ×6
BNDG CMPR 9X4 STRL LF SNTH (GAUZE/BANDAGES/DRESSINGS) ×1
BNDG COHESIVE 4X5 TAN STRL (GAUZE/BANDAGES/DRESSINGS) IMPLANT
BNDG ELASTIC 3X5.8 VLCR STR LF (GAUZE/BANDAGES/DRESSINGS) ×2 IMPLANT
BNDG ELASTIC 4X5.8 VLCR STR LF (GAUZE/BANDAGES/DRESSINGS) ×3 IMPLANT
BNDG ESMARK 4X9 LF (GAUZE/BANDAGES/DRESSINGS) ×3 IMPLANT
CHLORAPREP W/TINT 26 (MISCELLANEOUS) ×3 IMPLANT
CLOSURE STERI-STRIP 1/2X4 (GAUZE/BANDAGES/DRESSINGS) ×1
CLOSURE WOUND 1/2 X4 (GAUZE/BANDAGES/DRESSINGS) ×1
CLSR STERI-STRIP ANTIMIC 1/2X4 (GAUZE/BANDAGES/DRESSINGS) ×2 IMPLANT
CORD BIPOLAR FORCEPS 12FT (ELECTRODE) IMPLANT
COVER BACK TABLE 60X90IN (DRAPES) IMPLANT
COVER WAND RF STERILE (DRAPES) ×3 IMPLANT
CUFF TOURN SGL QUICK 18X4 (TOURNIQUET CUFF) IMPLANT
CUFF TOURN SGL QUICK 24 (TOURNIQUET CUFF)
CUFF TRNQT CYL 24X4X16.5-23 (TOURNIQUET CUFF) IMPLANT
DRAPE EXTREMITY T 121X128X90 (DISPOSABLE) ×3 IMPLANT
DRAPE IMP U-DRAPE 54X76 (DRAPES) ×3 IMPLANT
DRAPE OEC MINIVIEW 54X84 (DRAPES) ×3 IMPLANT
DRAPE U-SHAPE 47X51 STRL (DRAPES) ×3 IMPLANT
DRSG EMULSION OIL 3X3 NADH (GAUZE/BANDAGES/DRESSINGS) ×2 IMPLANT
ELECT REM PT RETURN 9FT ADLT (ELECTROSURGICAL) ×3
ELECTRODE REM PT RTRN 9FT ADLT (ELECTROSURGICAL) ×1 IMPLANT
GAUZE SPONGE 4X4 12PLY STRL (GAUZE/BANDAGES/DRESSINGS) ×3 IMPLANT
GAUZE XEROFORM 1X8 LF (GAUZE/BANDAGES/DRESSINGS) IMPLANT
GLOVE BIO SURGEON STRL SZ7.5 (GLOVE) ×6 IMPLANT
GLOVE BIOGEL PI IND STRL 8 (GLOVE) ×2 IMPLANT
GLOVE BIOGEL PI INDICATOR 8 (GLOVE) ×4
GOWN STRL REUS W/TWL LRG LVL3 (GOWN DISPOSABLE) ×8 IMPLANT
GOWN STRL REUS W/TWL XL LVL3 (GOWN DISPOSABLE) ×6 IMPLANT
NDL HYPO 25X1 1.5 SAFETY (NEEDLE) IMPLANT
NEEDLE HYPO 25X1 1.5 SAFETY (NEEDLE) IMPLANT
NS IRRIG 1000ML POUR BTL (IV SOLUTION) ×3 IMPLANT
PACK DSU ARTHROSCOPY (CUSTOM PROCEDURE TRAY) ×3 IMPLANT
PAD CAST 4YDX4 CTTN HI CHSV (CAST SUPPLIES) ×1 IMPLANT
PADDING CAST ABS 4INX4YD NS (CAST SUPPLIES) ×2
PADDING CAST ABS COTTON 4X4 ST (CAST SUPPLIES) ×1 IMPLANT
PADDING CAST COTTON 4X4 STRL (CAST SUPPLIES) ×12
PASSER SUT SWANSON 36MM LOOP (INSTRUMENTS) IMPLANT
PENCIL BUTTON HOLSTER BLD 10FT (ELECTRODE) ×3 IMPLANT
PLATE STR 6H (Plate) ×2 IMPLANT
PLATE TUBULAR 1/3 6H (Plate) ×2 IMPLANT
SCREW BN T10 FT 14X3.5XSTRDR (Screw) IMPLANT
SCREW BONE 2.7X16 NON-LOCKING (Screw) ×2 IMPLANT
SCREW BONE 3.5X14MM (Screw) ×18 IMPLANT
SCREW BONE THRD T10 3.5X12 (Screw) ×10 IMPLANT
SCREWDRIVER BLADE T-10 (BLADE) ×2 IMPLANT
SET BASIN DAY SURGERY F.S. (CUSTOM PROCEDURE TRAY) ×3 IMPLANT
SLING ARM FOAM STRAP LRG (SOFTGOODS) ×2 IMPLANT
SLING ARM FOAM STRAP MED (SOFTGOODS) IMPLANT
SLING ARM FOAM STRAP XLG (SOFTGOODS) IMPLANT
SLING ARM IMMOBILIZER LRG (SOFTGOODS) IMPLANT
SLING ARM IMMOBILIZER MED (SOFTGOODS) IMPLANT
SPLINT FAST PLASTER 5X30 (CAST SUPPLIES) ×20
SPLINT PLASTER CAST FAST 5X30 (CAST SUPPLIES) ×10 IMPLANT
SPONGE LAP 18X18 RF (DISPOSABLE) ×3 IMPLANT
STAPLER VISISTAT 35W (STAPLE) IMPLANT
STRIP CLOSURE SKIN 1/2X4 (GAUZE/BANDAGES/DRESSINGS) ×1 IMPLANT
SUCTION FRAZIER HANDLE 10FR (MISCELLANEOUS) ×3
SUCTION TUBE FRAZIER 10FR DISP (MISCELLANEOUS) ×1 IMPLANT
SUT ETHILON 3 0 PS 1 (SUTURE) IMPLANT
SUT FIBERWIRE #2 38 T-5 BLUE (SUTURE)
SUT MNCRL AB 4-0 PS2 18 (SUTURE) ×2 IMPLANT
SUT PROLENE 3 0 PS 2 (SUTURE) IMPLANT
SUT VIC AB 0 CT1 27 (SUTURE) ×3
SUT VIC AB 0 CT1 27XBRD ANBCTR (SUTURE) ×1 IMPLANT
SUT VIC AB 0 SH 27 (SUTURE) IMPLANT
SUT VIC AB 2-0 SH 27 (SUTURE) ×6
SUT VIC AB 2-0 SH 27XBRD (SUTURE) IMPLANT
SUT VIC AB 3-0 SH 27 (SUTURE)
SUT VIC AB 3-0 SH 27X BRD (SUTURE) IMPLANT
SUTURE FIBERWR #2 38 T-5 BLUE (SUTURE) IMPLANT
SYR BULB EAR ULCER 3OZ GRN STR (SYRINGE) ×3 IMPLANT
SYR CONTROL 10ML LL (SYRINGE) IMPLANT
TOWEL OR 17X26 10 PK STRL BLUE (TOWEL DISPOSABLE) ×3 IMPLANT
TUBE CONNECTING 12'X1/4 (SUCTIONS)
TUBE CONNECTING 12X1/4 (SUCTIONS) IMPLANT
UNDERPAD 30X30 (UNDERPADS AND DIAPERS) ×3 IMPLANT
YANKAUER SUCT BULB TIP NO VENT (SUCTIONS) IMPLANT
t-10 driver blade ×1 IMPLANT

## 2020-04-13 NOTE — Anesthesia Procedure Notes (Addendum)
Procedure Name: LMA Insertion Date/Time: 04/13/2020 8:52 AM Performed by: Norva Pavlov, CRNA Pre-anesthesia Checklist: Patient identified, Emergency Drugs available, Suction available and Patient being monitored Patient Re-evaluated:Patient Re-evaluated prior to induction Oxygen Delivery Method: Circle system utilized Preoxygenation: Pre-oxygenation with 100% oxygen Induction Type: IV induction Ventilation: Mask ventilation without difficulty LMA: LMA inserted LMA Size: 4.0 Number of attempts: 1 Airway Equipment and Method: Bite block Placement Confirmation: positive ETCO2 Tube secured with: Tape Dental Injury: Teeth and Oropharynx as per pre-operative assessment

## 2020-04-13 NOTE — Anesthesia Postprocedure Evaluation (Signed)
Anesthesia Post Note  Patient: Holly Baxter  Procedure(s) Performed: OPEN REDUCTION INTERNAL FIXATION (ORIF) RADIAL AND ULNAR  FRACTURE (Left Arm Lower)     Patient location during evaluation: PACU Anesthesia Type: General Level of consciousness: awake and alert and oriented Pain management: pain level controlled Vital Signs Assessment: post-procedure vital signs reviewed and stable Respiratory status: spontaneous breathing, nonlabored ventilation and respiratory function stable Cardiovascular status: blood pressure returned to baseline and stable Postop Assessment: no apparent nausea or vomiting Anesthetic complications: no    Last Vitals:  Vitals:   04/13/20 1115 04/13/20 1150  BP: 120/80 124/76  Pulse: 81 73  Resp: (!) 22 18  Temp:  (!) 36.4 C  SpO2: 93% 95%    Last Pain:  Vitals:   04/13/20 1150  TempSrc: Oral  PainSc: 0-No pain                 Josef Tourigny A.

## 2020-04-13 NOTE — Interval H&P Note (Signed)
I participated in the care of this patient and agree with the above history, physical and evaluation. I performed a review of the history and a physical exam as detailed   Ples Trudel Daniel Mang Hazelrigg MD  

## 2020-04-13 NOTE — Discharge Instructions (Signed)
Keep arm elevated with ice above your heart as much as possible to reduce pain and swelling.  Diet: As you were doing prior to hospitalization   Shower:  You have a splint on, leave the splint in place and keep the splint dry with a plastic bag.  Dressing:  You have a splint - leave the splint in place and we will change your bandages during your first follow-up appointment.    Activity:  Increase activity slowly as tolerated, but follow the weight bearing instructions below.  The rules on driving is that you can not be taking narcotics while you drive, and you must feel in control of the vehicle.    Weight Bearing:  Non weight bearing affected arm.  Sling for comfort.   To prevent constipation: you may use a stool softener such as -  Colace (over the counter) 100 mg by mouth twice a day  Drink plenty of fluids (prune juice may be helpful) and high fiber foods Miralax (over the counter) for constipation as needed.    Itching:  If you experience itching with your medications, try taking only a single pain pill, or even half a pain pill at a time.  You can also use benadryl over the counter for itching or also to help with sleep.   Precautions:  If you experience chest pain or shortness of breath - call 911 immediately for transfer to the hospital emergency department!!  If you develop a fever greater that 101 F, purulent drainage from wound, increased redness or drainage from wound, or calf pain -- Call the office at [phone]                                         Follow- Up Appointment:  Please call for an appointment to be seen in 2 weeks  - [phone]     Post Anesthesia Home Care Instructions  Activity: Get plenty of rest for the remainder of the day. A responsible adult should stay with you for 24 hours following the procedure.  For the next 24 hours, DO NOT: -Drive a car -Advertising copywriter -Drink alcoholic beverages -Take any medication unless instructed  by your physician -Make any legal decisions or sign important papers.  Meals: Start with liquid foods such as gelatin or soup. Progress to regular foods as tolerated. Avoid greasy, spicy, heavy foods. If nausea and/or vomiting occur, drink only clear liquids until the nausea and/or vomiting subsides. Call your physician if vomiting continues.  Special Instructions/Symptoms: Your throat may feel dry or sore from the anesthesia or the breathing tube placed in your throat during surgery. If this causes discomfort, gargle with warm salt water. The discomfort should disappear within 24 hours.  If you had a scopolamine patch placed behind your ear for the management of post- operative nausea and/or vomiting:  1. The medication in the patch is effective for 72 hours, after which it should be removed.  Wrap patch in a tissue and discard in the trash. Wash hands thoroughly with soap and water. 2. You may remove the patch earlier than 72 hours if you experience unpleasant side effects which may include dry mouth, dizziness or visual disturbances. 3. Avoid touching the patch. Wash your hands with soap and water after contact with the patch.   Regional Anesthesia Blocks  1. Numbness or the inability to move the "blocked" extremity may last  from 3-48 hours after placement. The length of time depends on the medication injected and your individual response to the medication. If the numbness is not going away after 48 hours, call your surgeon.  2. The extremity that is blocked will need to be protected until the numbness is gone and the  Strength has returned. Because you cannot feel it, you will need to take extra care to avoid injury. Because it may be weak, you may have difficulty moving it or using it. You may not know what position it is in without looking at it while the block is in effect.  3. For blocks in the legs and feet, returning to weight bearing and walking needs to be done carefully. You will  need to wait until the numbness is entirely gone and the strength has returned. You should be able to move your leg and foot normally before you try and bear weight or walk. You will need someone to be with you when you first try to ensure you do not fall and possibly risk injury.  4. Bruising and tenderness at the needle site are common side effects and will resolve in a few days.  5. Persistent numbness or new problems with movement should be communicated to the surgeon or the [place] [phone] [place] [phone]).

## 2020-04-13 NOTE — Anesthesia Procedure Notes (Signed)
Anesthesia Regional Block: Supraclavicular block   Pre-Anesthetic Checklist: ,, timeout performed, Correct Patient, Correct Site, Correct Laterality, Correct Procedure, Correct Position, site marked, Risks and benefits discussed,  Surgical consent,  Pre-op evaluation,  At surgeon's request and post-op pain management  Laterality: Left  Prep: chloraprep       Needles:  Injection technique: Single-shot      Additional Needles:   Procedures:,,,, ultrasound used (permanent image in chart),,,,  Narrative:  Start time: 04/13/2020 8:00 AM End time: 04/13/2020 8:05 AM Injection made incrementally with aspirations every 5 mL.  Performed by: Personally  Anesthesiologist: Mal Amabile, MD  Additional Notes: Timeout performed. Patient sedated. Relevant anatomy ID'd using [place]. Incremental 2-104ml injection of LA with frequent aspiration. Patient tolerated procedure well.        Left Supraclavicular Block
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
## 2020-04-13 NOTE — Anesthesia Preprocedure Evaluation (Signed)
Anesthesia Evaluation  Patient identified by MRN, date of birth, ID band Patient awake    Reviewed: Allergy & Precautions, NPO status , Patient's Chart, lab work & pertinent test results  Airway Mallampati: II  TM Distance: >3 FB Neck ROM: Full    Dental  (+) Poor Dentition   Pulmonary neg pulmonary ROS, Current Smoker,    Pulmonary exam normal breath sounds clear to auscultation       Cardiovascular negative cardio ROS Normal cardiovascular exam Rhythm:Regular Rate:Normal     Neuro/Psych PSYCHIATRIC DISORDERS Bipolar Disorder Schizophrenia    GI/Hepatic negative GI ROS, (+)     substance abuse  alcohol use, cocaine use, marijuana use, methamphetamine use and IV drug use, Polysubstance abuse   Endo/Other  Obesity   Renal/GU negative Renal ROS  negative genitourinary   Musculoskeletal  (+) narcotic dependentFx shaft ulna and radius   Abdominal (+) + obese,   Peds  Hematology negative hematology ROS (+)   Anesthesia Other Findings   Reproductive/Obstetrics                             Anesthesia Physical Anesthesia Plan  ASA: III  Anesthesia Plan: General   Post-op Pain Management:  Regional for Post-op pain   Induction:   PONV Risk Score and Plan: 3 and Ondansetron, Treatment may vary due to age or medical condition and Midazolam  Airway Management Planned: LMA  Additional Equipment:   Intra-op Plan:   Post-operative Plan: Extubation in OR  Informed Consent: I have reviewed the patients History and Physical, chart, labs and discussed the procedure including the risks, benefits and alternatives for the proposed anesthesia with the patient or authorized representative who has indicated his/her understanding and acceptance.     Dental advisory given  Plan Discussed with: CRNA and Surgeon  Anesthesia Plan Comments:         Anesthesia Quick Evaluation

## 2020-04-13 NOTE — Transfer of Care (Signed)
  Last Vitals:  Vitals Value Taken Time  BP 128/81 04/13/20 1032  Temp 36.2 C 04/13/20 1032  Pulse 78 04/13/20 1035  Resp 21 04/13/20 1035  SpO2 98 % 04/13/20 1035  Vitals shown include unvalidated device data.  Last Pain:  Vitals:   04/13/20 0741  TempSrc: Oral  PainSc: 4       Patients Stated Pain Goal: 6 (04/13/20 0741) Immediate Anesthesia Transfer of Care Note  Patient: Holly Baxter  Procedure(s) Performed: Procedure(s) (LRB): OPEN REDUCTION INTERNAL FIXATION (ORIF) RADIAL AND ULNAR  FRACTURE (Left)  Patient Location: PACU  Anesthesia Type: General  Level of Consciousness: awake, alert  and oriented  Airway & Oxygen Therapy: Patient Spontanous Breathing and Patient connected to nasal cannula oxygen  Post-op Assessment: Report given to PACU RN and Post -op Vital signs reviewed and stable  Post vital signs: Reviewed and stable  Complications: No apparent anesthesia complications

## 2020-04-13 NOTE — Progress Notes (Signed)
Assisted Dr. Malen Gauze with left, ultrasound guided interscalene  block, . Side rails up, monitors on throughout procedure. See vital signs in flow sheet. Tolerated Procedure well.

## 2020-04-15 NOTE — Op Note (Addendum)
This NOTE was placed under the wrong patient. It does not apply to this patient. Please see alternate op note for day 5/25       04/13/2020  6:59 AM  PATIENT:  Holly Baxter    PRE-OPERATIVE DIAGNOSIS:  LEFT ULNAR AND RADIAL SHAFT FRACTURES  POST-OPERATIVE DIAGNOSIS:  Same  PROCEDURE:  OPEN REDUCTION INTERNAL FIXATION (ORIF) RADIAL AND ULNAR  FRACTURE  SURGEON:  Sheral Apley, MD  ASSISTANT: Aquilla Hacker, PA-C, he was present and scrubbed throughout the case, critical for completion in a timely fashion, and for retraction, instrumentation, and closure.   ANESTHESIA:   gen  PREOPERATIVE INDICATIONS:  Holly Baxter is a  39 y.o. female with a diagnosis of LEFT ULNAR AND RADIAL SHAFT FRACTURES who failed conservative measures and elected for surgical management.    The risks benefits and alternatives were discussed with the patient preoperatively including but not limited to the risks of infection, bleeding, nerve injury, cardiopulmonary complications, the need for revision surgery, among others, and the patient was willing to proceed.  OPERATIVE IMPLANTS: none  OPERATIVE FINDINGS: purulent fluid, tenosynovitis  BLOOD LOSS: min  COMPLICATIONS: nonoe  TOURNIQUET TIME: none  OPERATIVE PROCEDURE:  Patient was identified in the preoperative holding area and site was marked by me She was transported to the operating theater and placed on the table in supine position taking care to pad all bony prominences. After a preincinduction time out anesthesia was induced. The left upper extremity was prepped and draped in normal sterile fashion and a pre-incision timeout was performed. She received ancef for preoperative antibiotics.   I made a dorsal incisioin and expressed a large amount of purulent fluid. I sent this for culture and then abx were given.   I debrided devitalized tissue with a pickup and scissor. This was fascia and skin  I then performed a tenolysis of 4  extensor tendons in the hand of the index, long, ring, and small finger.   I irrigated with 3L of saline. I then closed and placed a drain.      POST OPERATIVE PLAN: IV abx, and dvt px per primary team. mobilize

## 2020-04-19 NOTE — Op Note (Signed)
04/13/2020  1:51 PM  PATIENT:  Holly Baxter    PRE-OPERATIVE DIAGNOSIS:  LEFT ULNAR AND RADIAL SHAFT FRACTURES  POST-OPERATIVE DIAGNOSIS:  Same  PROCEDURE:  OPEN REDUCTION INTERNAL FIXATION (ORIF) RADIAL AND ULNAR  FRACTURE  SURGEON:  Sheral Apley, MD  ASSISTANT: Aquilla Hacker, PA-C, he was present and scrubbed throughout the case, critical for completion in a timely fashion, and for retraction, instrumentation, and closure.   ANESTHESIA:   gen  PREOPERATIVE INDICATIONS:  Holly Baxter is a  39 y.o. female with a diagnosis of LEFT ULNAR AND RADIAL SHAFT FRACTURES who failed conservative measures and elected for surgical management.    The risks benefits and alternatives were discussed with the patient preoperatively including but not limited to the risks of infection, bleeding, nerve injury, cardiopulmonary complications, the need for revision surgery, among others, and the patient was willing to proceed.  OPERATIVE IMPLANTS: stryker plates  OPERATIVE FINDINGS: unstable fractures  BLOOD LOSS: 50  COMPLICATIONS: nonne  TOURNIQUET TIME: 60 min  OPERATIVE PROCEDURE:  Patient was identified in the preoperative holding area and site was marked by me She was transported to the operating theater and placed on the table in supine position taking care to pad all bony prominences. After a preincinduction time out anesthesia was induced. The left upper extremity was prepped and draped in normal sterile fashion and a pre-incision timeout was performed. She received ancef for preoperative antibiotics.   I first made a volar approach of Sherilyn Cooter to her fracture site based on the palpable location of the fracture.  I protected all neurovascular structures.  Identified the fracture site remove soft tissue from the interposition here and was able to then clamp it in place and placed a lag screw across the fracture.  I then selected a 6-hole plate and placed it on the volar aspect of the  radius and a compression fracture in a neutralization fashion with 6 bicortical screws and a solid bite on each of them.  Next I thoroughly irrigated and closed the radial incision.  I next turned my attention to the ulna where I made a incision directly over this fracture site.  I dissected down to the ulnar dorsal border and elevated the periosteum off of the fracture site.  I irrigated here and placed a 6-hole plate in a compression fashion across the ulna.  I then thoroughly irrigated and closed this incision I took multiple x-rays of her forearm and was happy with the placement of all hardware and fracture reduction.  Sterile dressings have been applied short arm splint was placed she was awoken taken the PACU in stable condition  POST OPERATIVE PLAN: DVT prophylaxis will consist of mobilization splint full-time

## 2020-05-05 ENCOUNTER — Encounter (HOSPITAL_COMMUNITY): Payer: Self-pay

## 2020-05-05 ENCOUNTER — Other Ambulatory Visit: Payer: Self-pay

## 2020-05-05 DIAGNOSIS — Z5321 Procedure and treatment not carried out due to patient leaving prior to being seen by health care provider: Secondary | ICD-10-CM | POA: Insufficient documentation

## 2020-05-05 DIAGNOSIS — M79602 Pain in left arm: Secondary | ICD-10-CM | POA: Insufficient documentation

## 2020-05-05 NOTE — ED Triage Notes (Signed)
Arrived PO. Patient reports Let arm pain. Patient reports she had plates and screws placed in arm and she thinks something "popped loose". Patient reports pain 10/10.

## 2020-05-06 ENCOUNTER — Emergency Department (HOSPITAL_COMMUNITY)
Admission: EM | Admit: 2020-05-06 | Discharge: 2020-05-06 | Disposition: A | Discharge status: Home / Self Care | Payer: Self-pay | Attending: Emergency Medicine | Admitting: Emergency Medicine

## 2020-05-06 ENCOUNTER — Emergency Department (HOSPITAL_COMMUNITY)
Admission: EM | Admit: 2020-05-06 | Discharge: 2020-05-06 | Discharge status: Against Medical Advice | Payer: Self-pay | Attending: Emergency Medicine | Admitting: Emergency Medicine

## 2020-05-06 DIAGNOSIS — Z5321 Procedure and treatment not carried out due to patient leaving prior to being seen by health care provider: Secondary | ICD-10-CM | POA: Insufficient documentation

## 2020-05-06 NOTE — ED Triage Notes (Signed)
Arrived by [facility]. Patient left AMA this morning returning with left arm pain and c/o "tripping over my own 2 feet".

## 2020-05-23 IMAGING — CT CT CERVICAL SPINE W/O CM
3 of 4 series · 11 of 33 positions shown, 13 images · non-contrast
Comparison: None.

CLINICAL DATA: Status post trauma.

EXAM:
CT CERVICAL SPINE WITHOUT CONTRAST
TECHNIQUE: Multidetector CT imaging of the cervical spine was performed without
intravenous contrast. Multiplanar CT image reconstructions were also
generated.

[Series 8: sag bone · sagittal · 0.20mm/px · 5 of 66 slices shown, 6 images]
[im 22/66  bone]
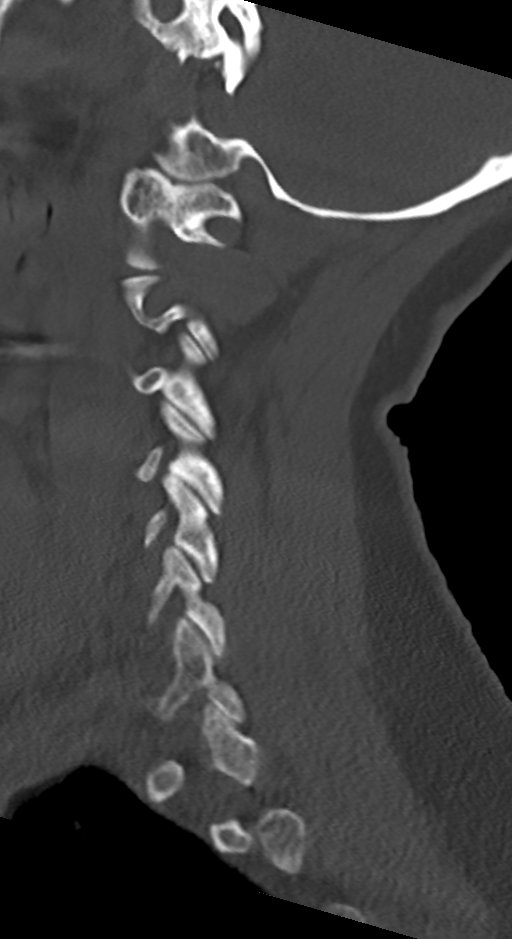
[im 28/66  bone]
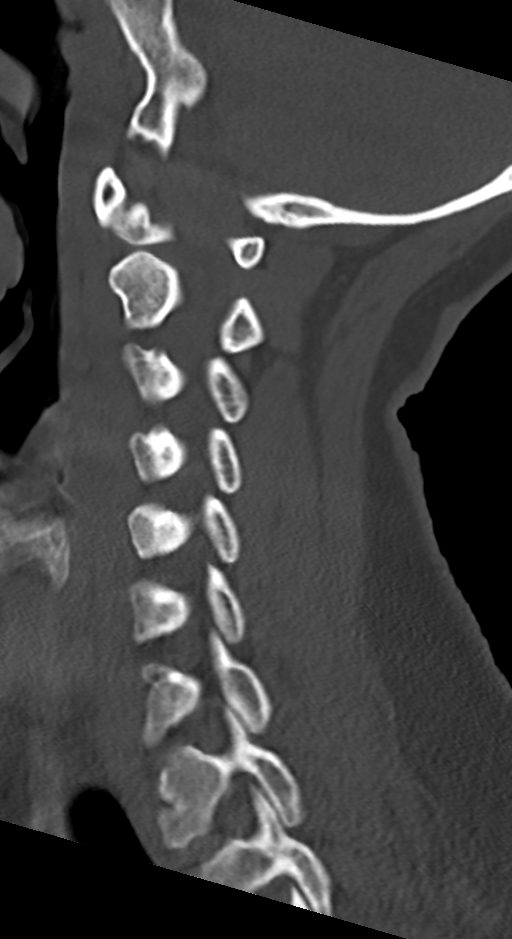
[im 33/66  soft-tissue]
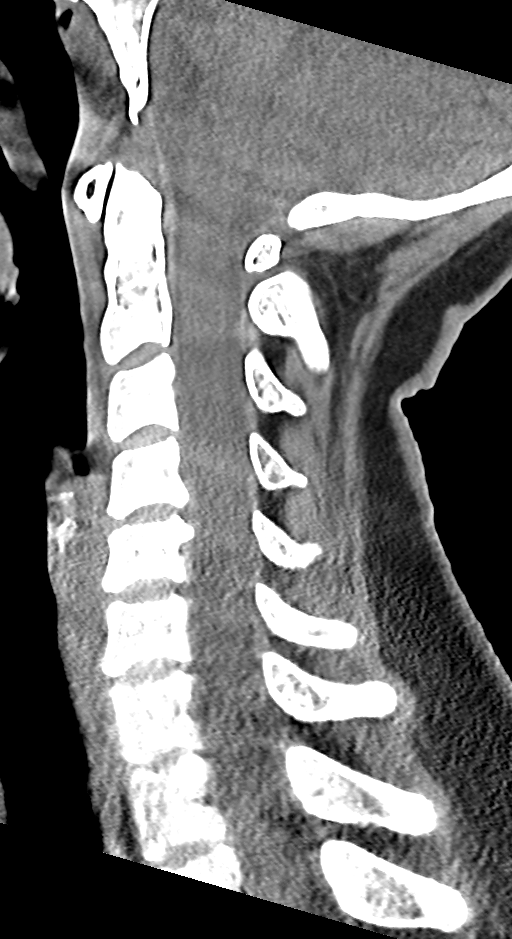
[im 33/66  bone]
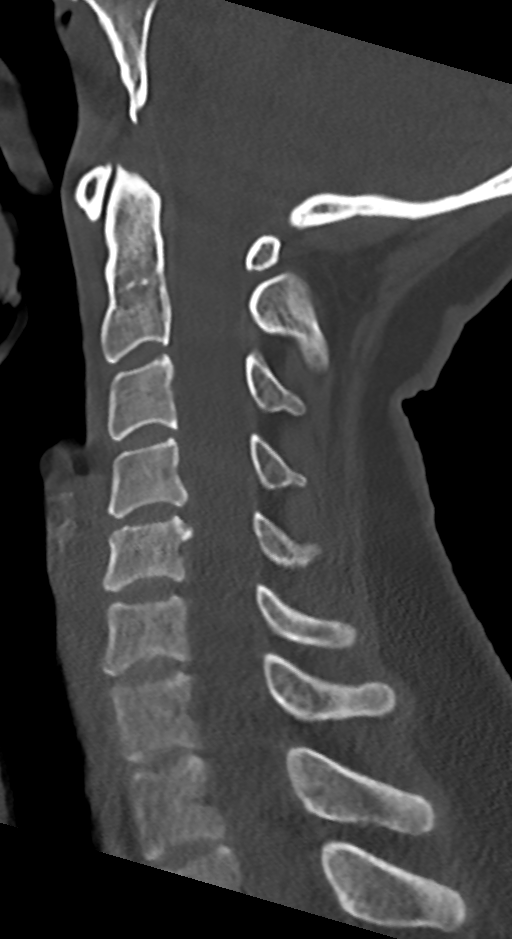
[im 38/66  bone]
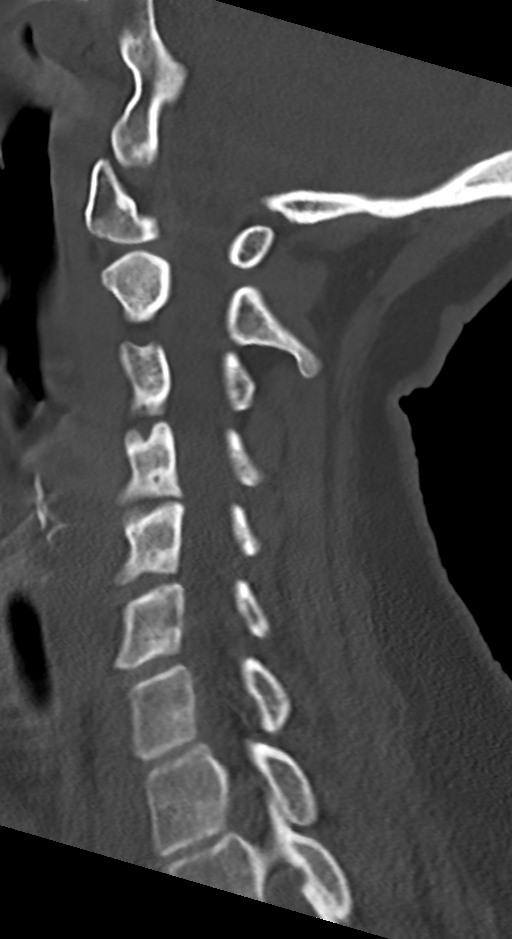
[im 44/66  bone]
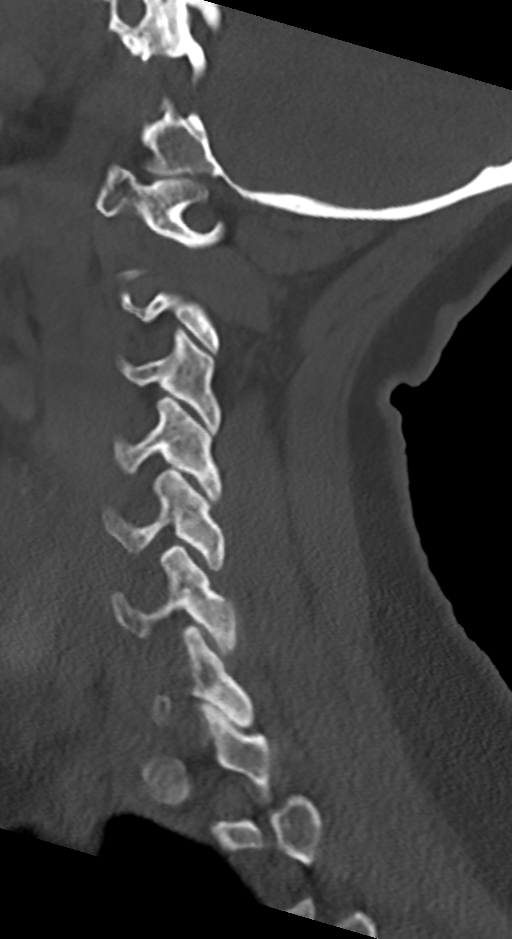

[Series 9: cor bone · coronal · 0.21mm/px · 3 of 61 slices shown]
[im 13/61  bone]
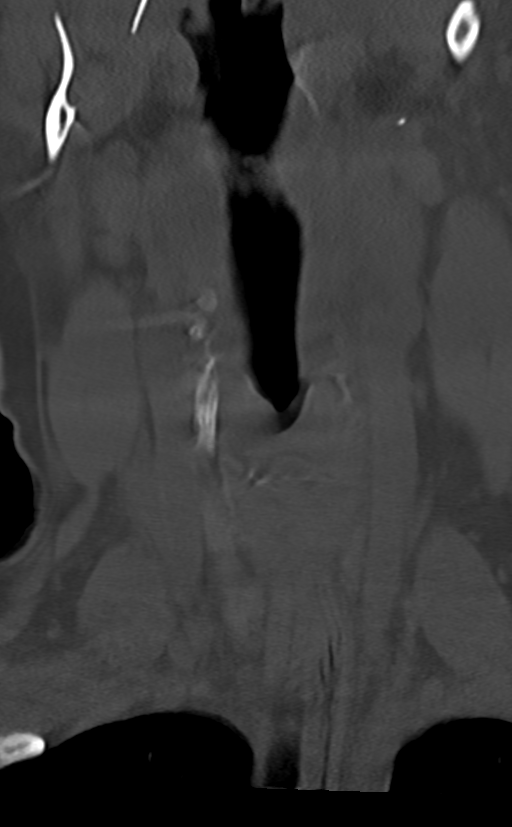
[im 25/61  bone]
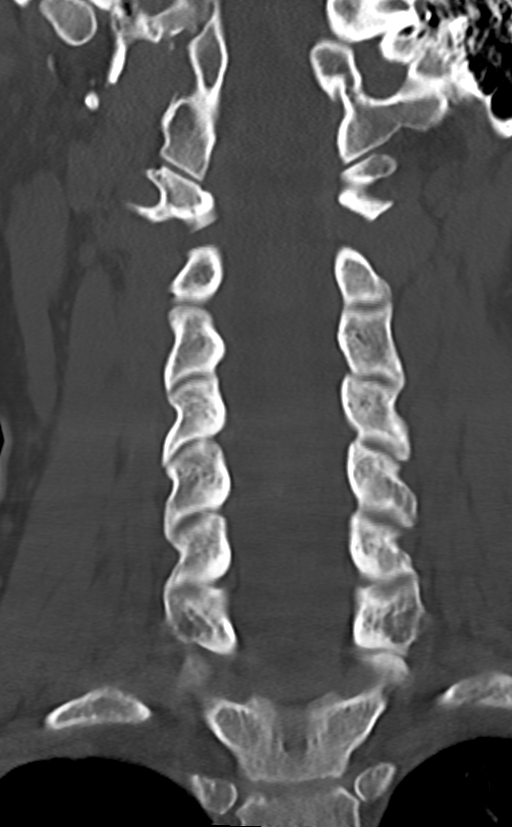
[im 37/61  bone]
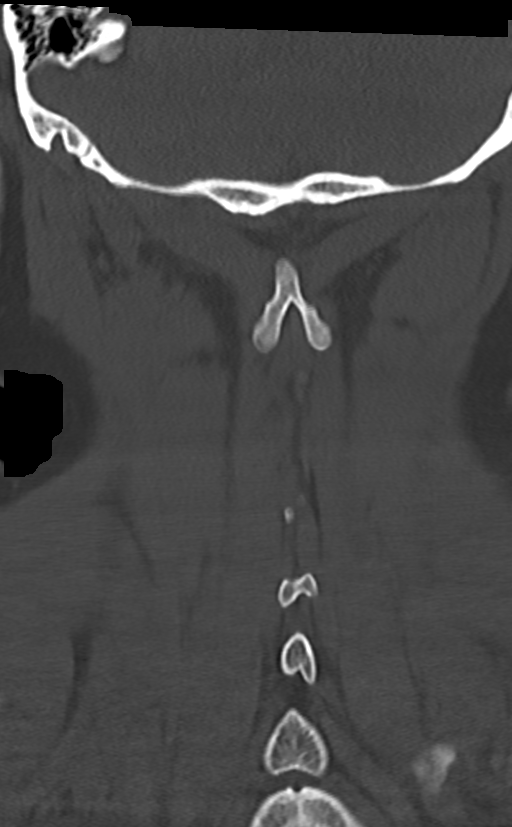

[Series 10: orthogonal axials · axial · 0.21mm/px · z∈[-277,-185]mm · 3 of 80 slices shown, 4 images]
[im 14/80  soft-tissue]
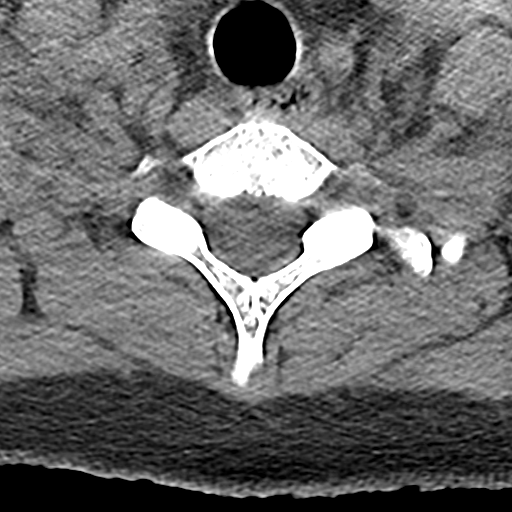
[im 14/80  bone]
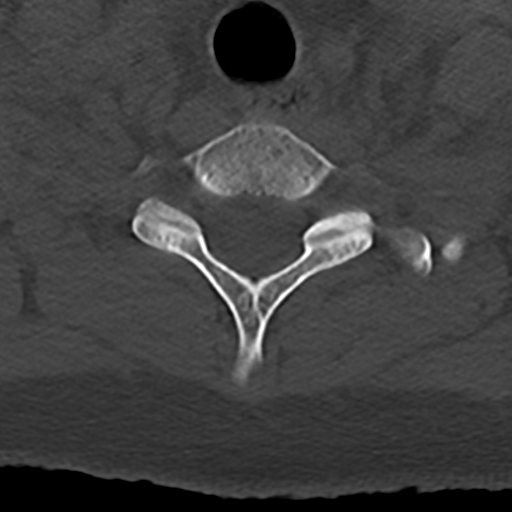
[im 40/80  bone]
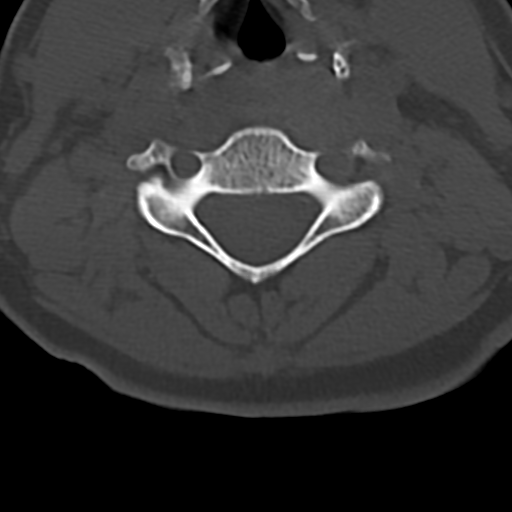
[im 66/80  bone]
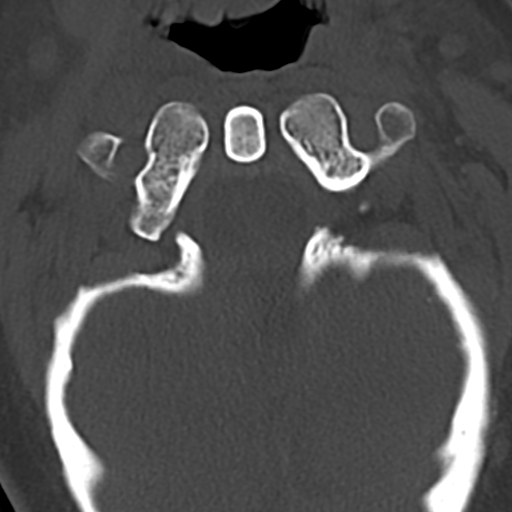

[11 of 33 positions shown; findings below may reference images not displayed]

FINDINGS: Alignment: Normal.

Skull base and vertebrae: No acute fracture. No primary bone lesion
or focal pathologic process.

Soft tissues and spinal canal: No prevertebral fluid or swelling. No
visible canal hematoma.

Disc levels: Normal multilevel endplates are seen with normal
multilevel intervertebral disc spaces.

Upper chest: Negative.

Other: None.
IMPRESSION: No evidence of acute fracture or subluxation of the cervical spine.

## 2020-05-23 IMAGING — CT CT HEAD W/O CM
4 series · 17 of 47 positions shown, 19 images · non-contrast
Comparison: None.

CLINICAL DATA: Status post trauma.

EXAM:
CT HEAD WITHOUT CONTRAST
TECHNIQUE: Contiguous axial images were obtained from the base of the skull
through the vertex without intravenous contrast.

[Series 3: head bone · axial · 0.39mm/px · z∈[-143,-87]mm · 4 of 79 slices shown]
[im 8/79  bone]
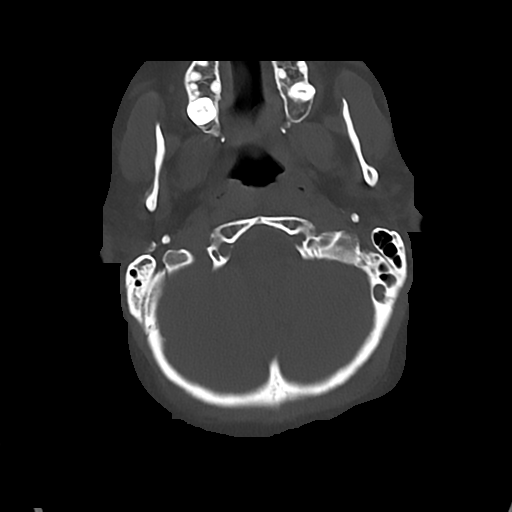
[im 16/79  bone]
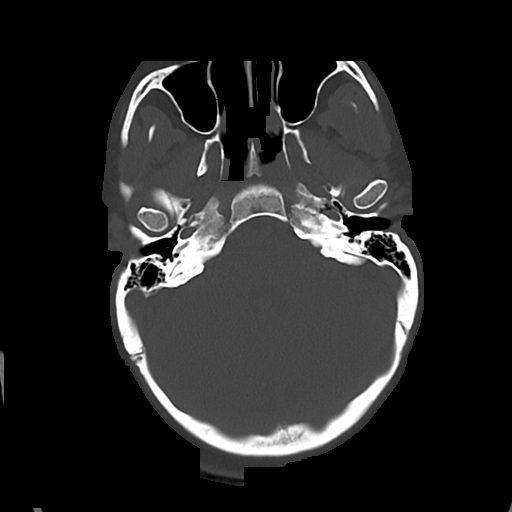
[im 24/79  bone]
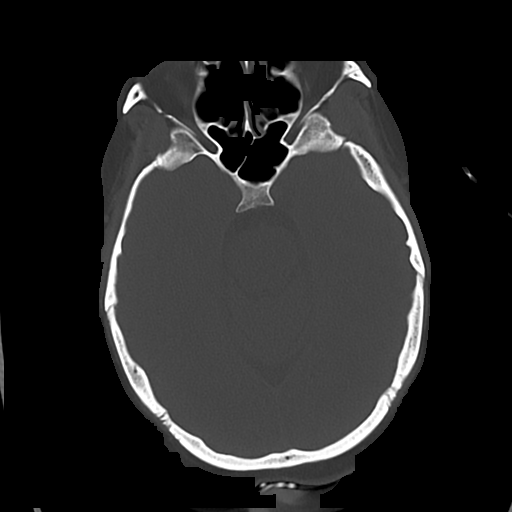
[im 36/79  bone]
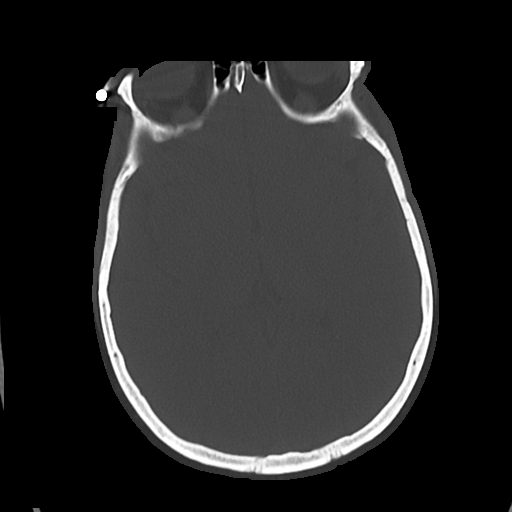

[Series 4: head wo · axial · 0.39mm/px · z∈[-142,-22]mm · 7 of 32 slices shown, 9 images]
[im 4/32  brain]
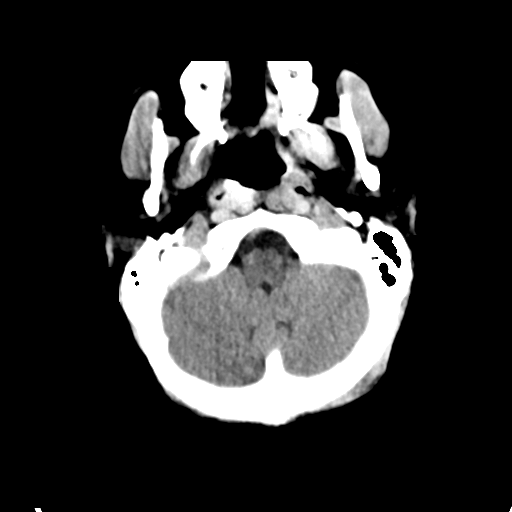
[im 4/32  bone]
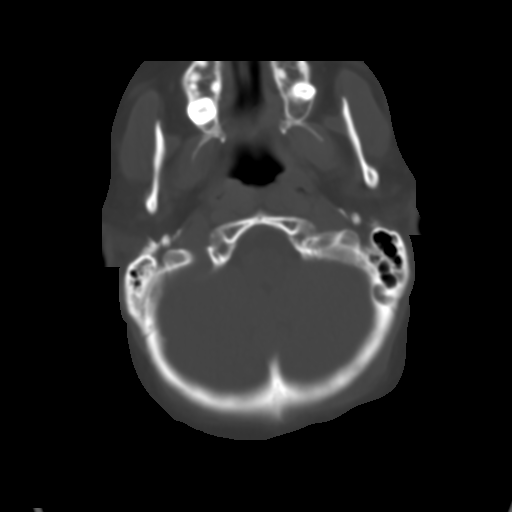
[im 8/32  brain]
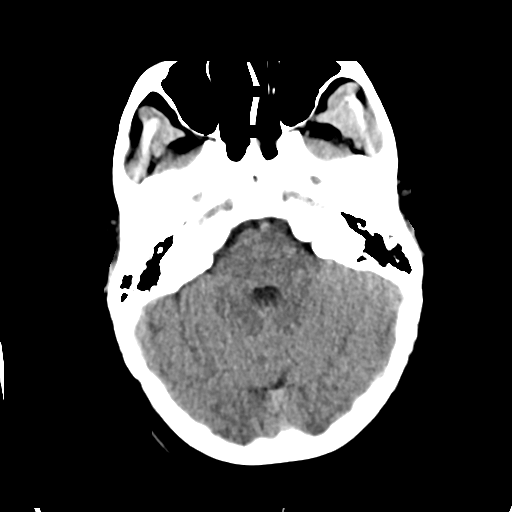
[im 12/32  brain]
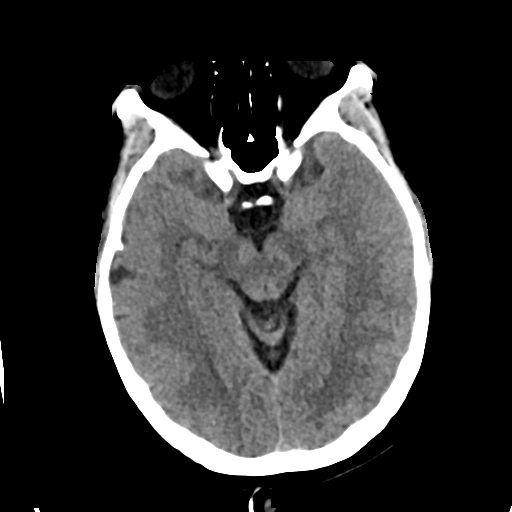
[im 16/32  brain]
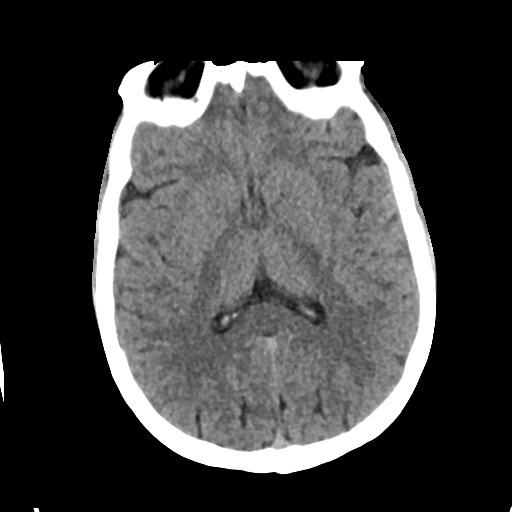
[im 20/32  brain]
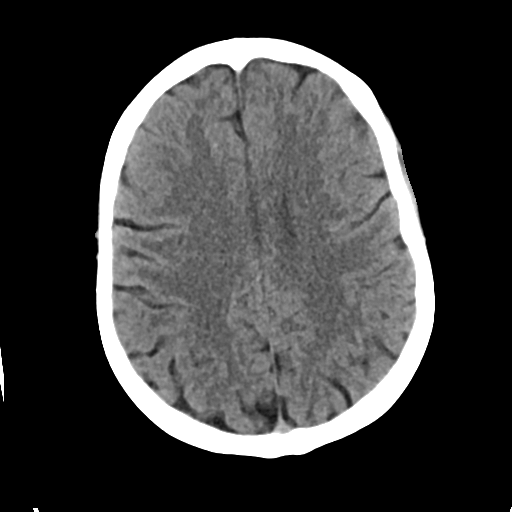
[im 20/32  bone]
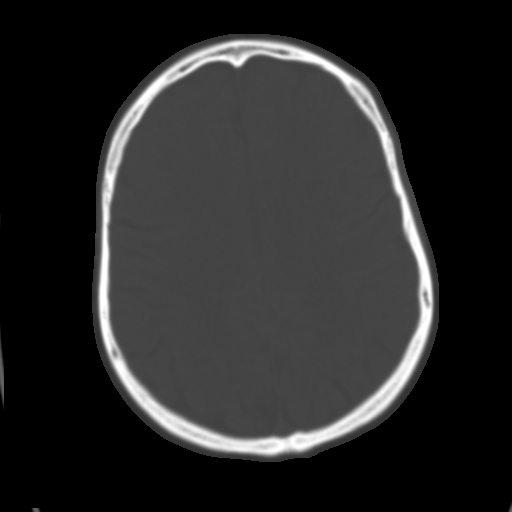
[im 24/32  brain]
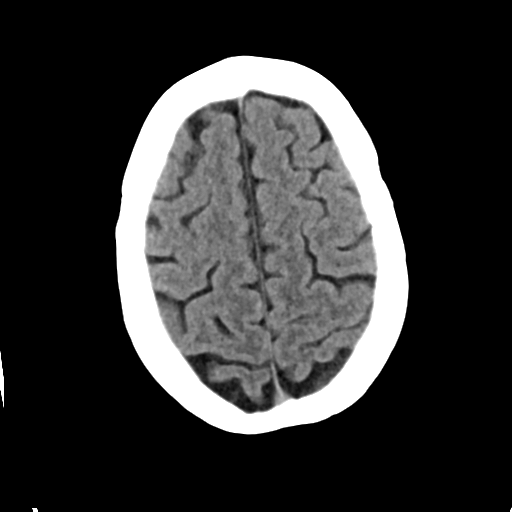
[im 28/32  brain]
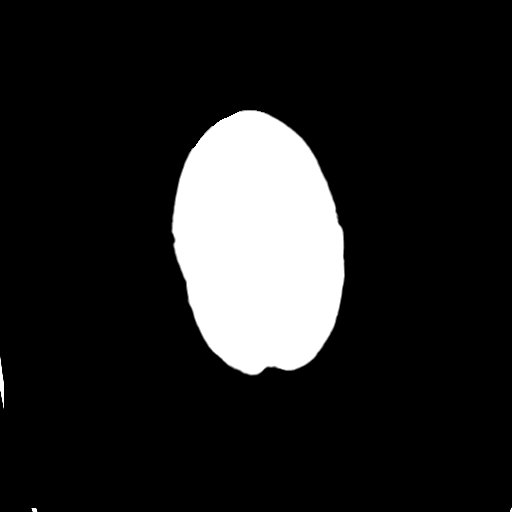

[Series 5: cor soft · coronal · 0.29mm/px · 3 of 70 slices shown]
[im 24/70  brain]
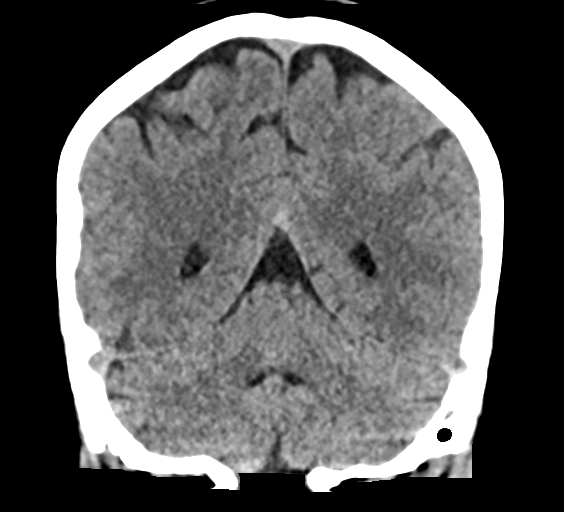
[im 31/70  brain]
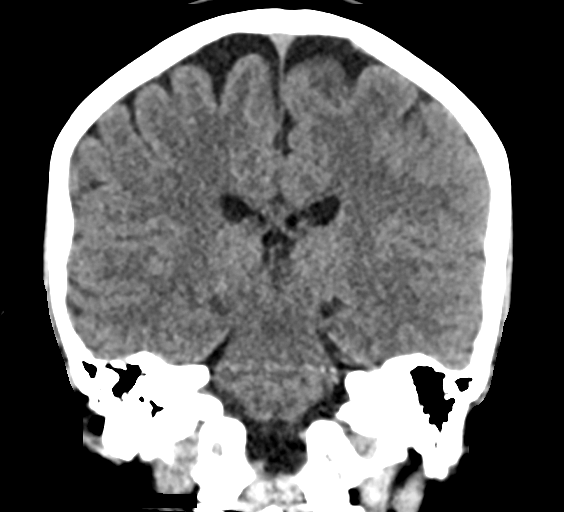
[im 39/70  brain]
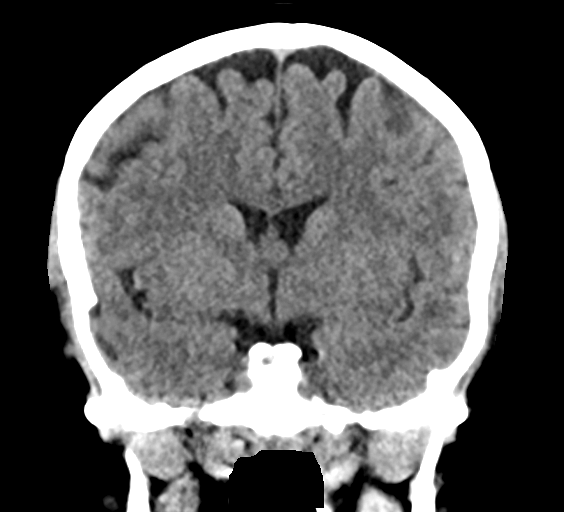

[Series 6: sag soft · sagittal · 0.33mm/px · 3 of 55 slices shown]
[im 19/55  brain]
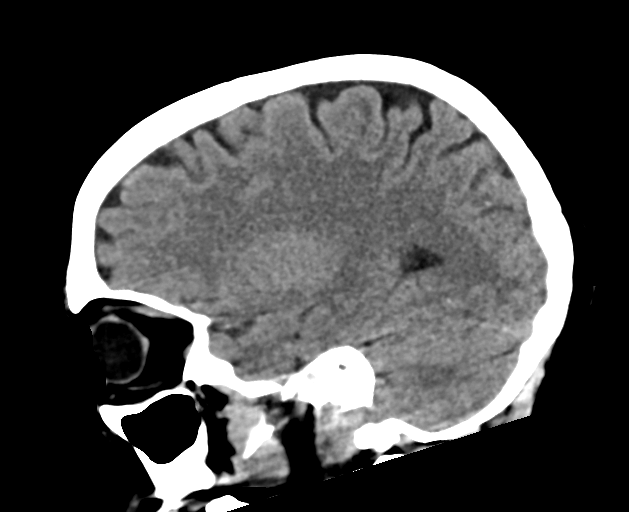
[im 28/55  brain]
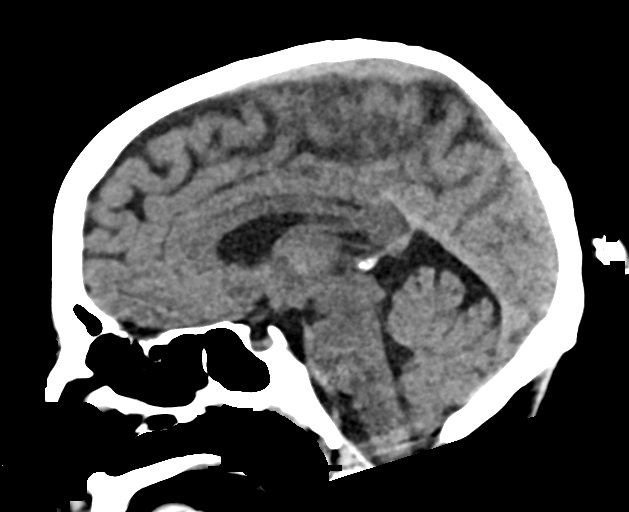
[im 37/55  brain]
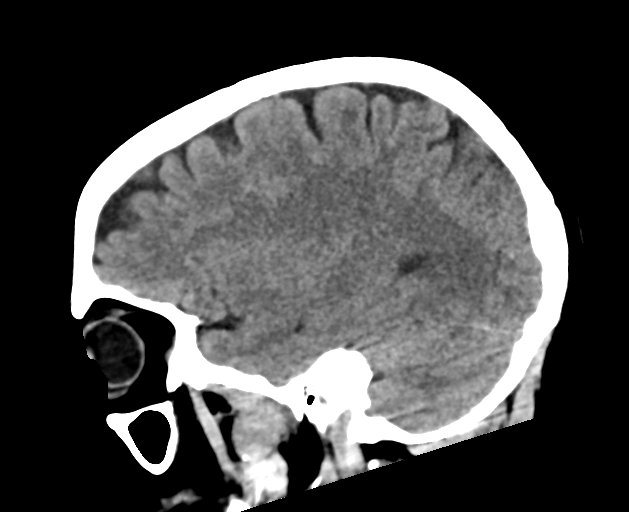

[17 of 47 positions shown; findings below may reference images not displayed]

FINDINGS: Brain: No evidence of acute infarction, hemorrhage, hydrocephalus,
extra-axial collection or mass lesion/mass effect.

Vascular: No hyperdense vessel or unexpected calcification.

Skull: Normal. Negative for fracture or focal lesion.

Sinuses/Orbits: No acute finding.

Other: None.
IMPRESSION: No acute intracranial pathology.
